# Patient Record
Sex: Male | Born: 1980 | Race: White | Hispanic: Yes | Marital: Single | Smoking: Never smoker
Health system: Southern US, Community
[De-identification: ages and names within clinical notes are randomized; demographics above are authoritative.]

---

## 2009-07-12 ENCOUNTER — Emergency Department (HOSPITAL_COMMUNITY): Admission: EM | Admit: 2009-07-12 | Discharge: 2009-07-12 | Payer: Self-pay | Admitting: Emergency Medicine

## 2011-04-24 ENCOUNTER — Other Ambulatory Visit: Payer: Self-pay | Admitting: Geriatric Medicine

## 2011-04-24 DIAGNOSIS — E291 Testicular hypofunction: Secondary | ICD-10-CM

## 2011-04-25 ENCOUNTER — Other Ambulatory Visit: Payer: Self-pay

## 2011-04-25 ENCOUNTER — Ambulatory Visit
Admission: RE | Admit: 2011-04-25 | Discharge: 2011-04-25 | Disposition: A | Discharge status: Home / Self Care | Payer: No Typology Code available for payment source | Source: Ambulatory Visit | Attending: Geriatric Medicine | Admitting: Geriatric Medicine

## 2011-04-25 DIAGNOSIS — E291 Testicular hypofunction: Secondary | ICD-10-CM

## 2011-06-25 ENCOUNTER — Other Ambulatory Visit: Payer: No Typology Code available for payment source

## 2011-06-25 ENCOUNTER — Other Ambulatory Visit: Payer: Self-pay | Admitting: Geriatric Medicine

## 2011-06-25 DIAGNOSIS — E291 Testicular hypofunction: Secondary | ICD-10-CM

## 2011-06-27 ENCOUNTER — Other Ambulatory Visit: Payer: No Typology Code available for payment source

## 2011-06-27 ENCOUNTER — Ambulatory Visit
Admission: RE | Admit: 2011-06-27 | Discharge: 2011-06-27 | Disposition: A | Discharge status: Home / Self Care | Payer: No Typology Code available for payment source | Source: Ambulatory Visit | Attending: Geriatric Medicine | Admitting: Geriatric Medicine

## 2011-06-27 DIAGNOSIS — E291 Testicular hypofunction: Secondary | ICD-10-CM

## 2011-06-27 MED ORDER — IOHEXOL 300 MG/ML  SOLN
75.0000 mL | Freq: Once | INTRAMUSCULAR | Status: AC | PRN
  Administered 2011-06-27: 75 mL via INTRAVENOUS

## 2013-06-21 IMAGING — CT CT HEAD W/ CM
3 of 4 series · 15 of 37 positions shown, 17 images · IV contrast (omnipaque)
Comparison: None.

CLINICAL DATA: Decreased testosterone.

CT HEAD WITH CONTRAST
TECHNIQUE: Contiguous axial images were obtained from the base of
the skull through the vertex with intravenous contrast
Contrast: 75mL OMNIPAQUE IOHEXOL 300 MG/ML  SOLN

[Series 4: recon 3: head w/cm · axial · 0.23mm/px · z∈[-29,+20]mm · 8 of 96 slices shown]
[im 9/96  brain]
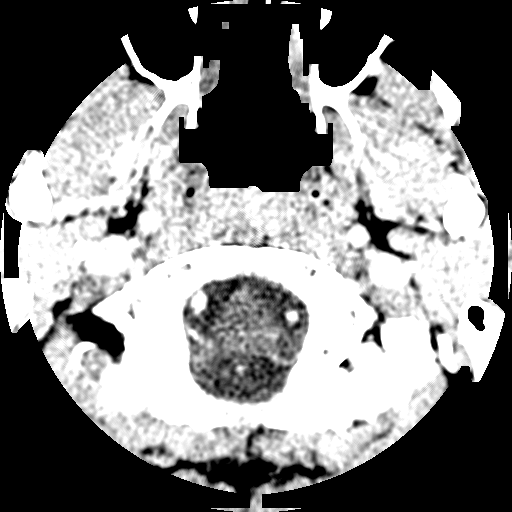
[im 18/96  brain]
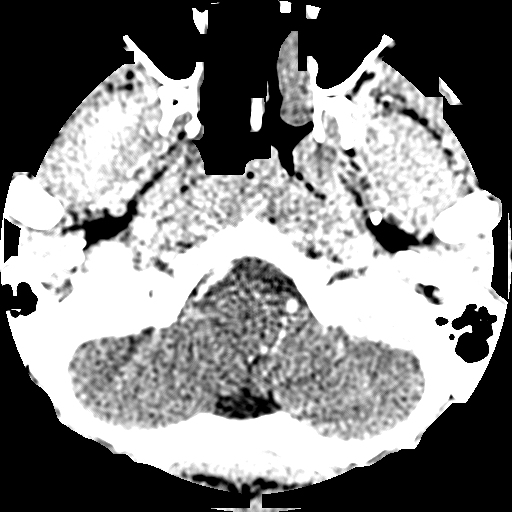
[im 31/96  brain]
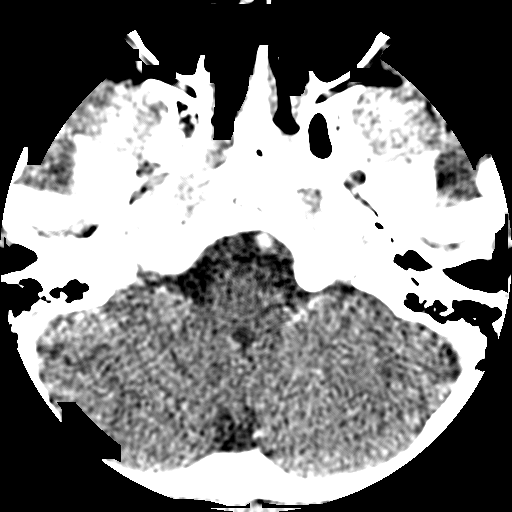
[im 44/96  brain]
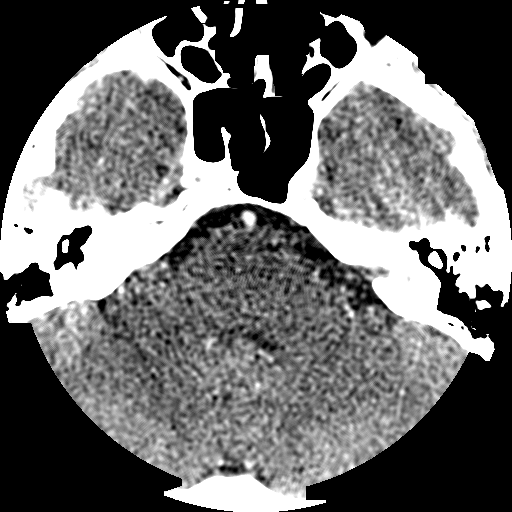
[im 52/96  brain]
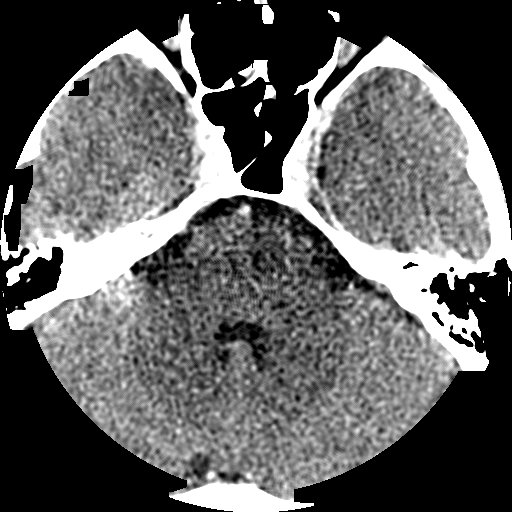
[im 65/96  brain]
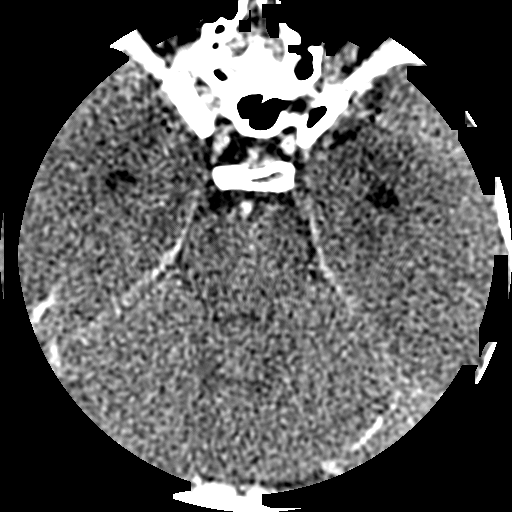
[im 78/96  brain]
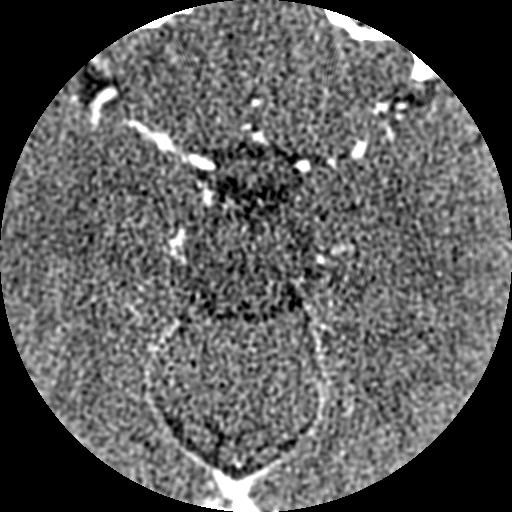
[im 87/96  brain]
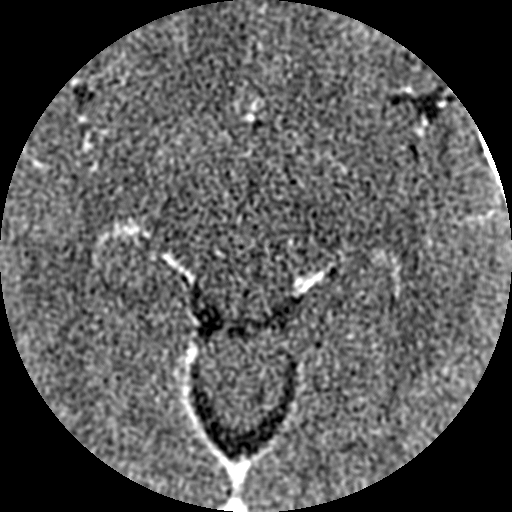

[Series 32: 3d filtered head w/cm · axial · 0.49mm/px · z∈[-12,+78]mm · 5 of 28 slices shown, 7 images]
[im 5/28  brain]
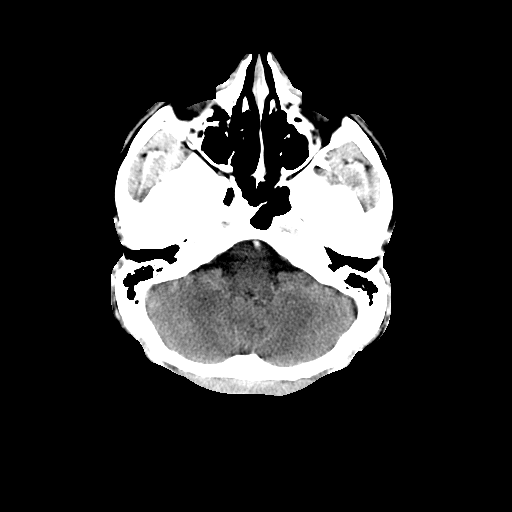
[im 5/28  bone]
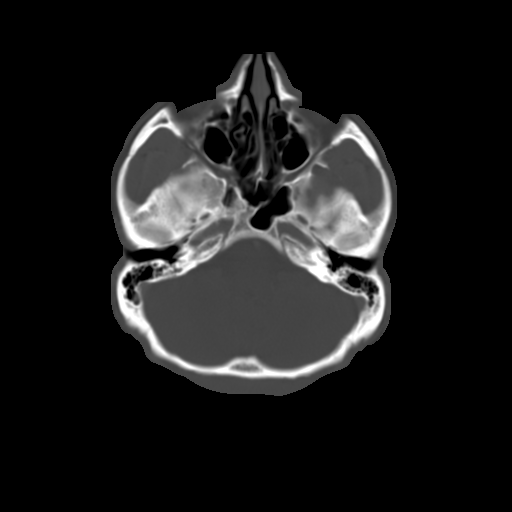
[im 10/28  brain]
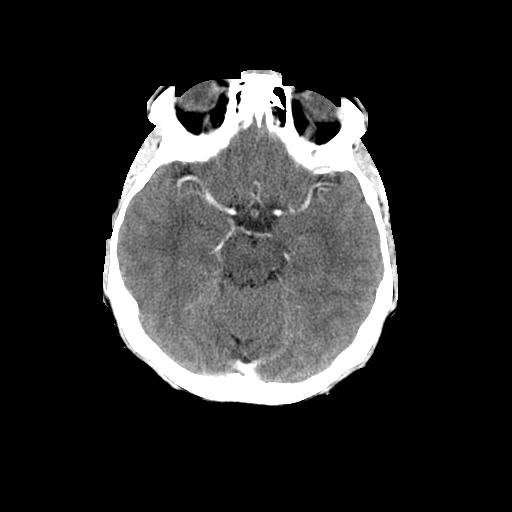
[im 14/28  brain]
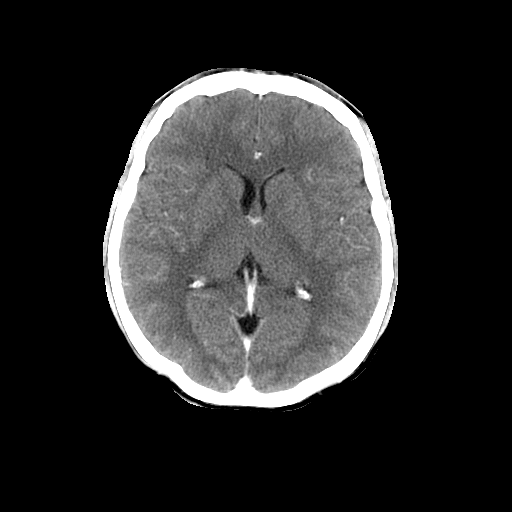
[im 19/28  brain]
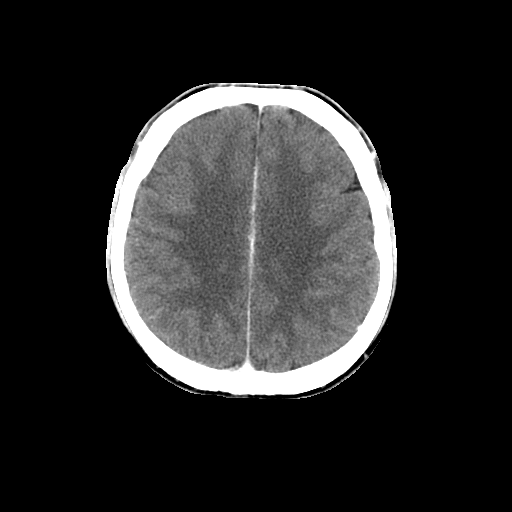
[im 23/28  brain]
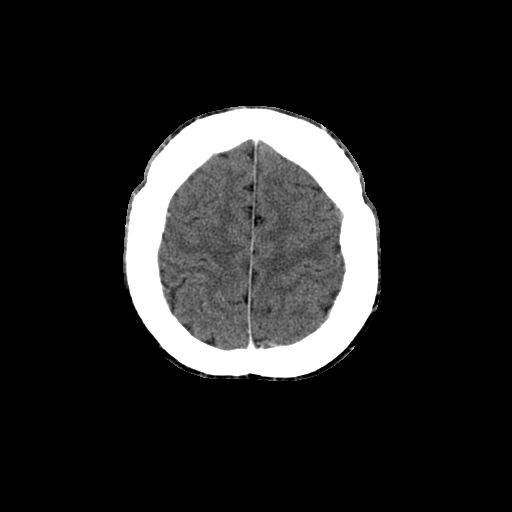
[im 23/28  bone]
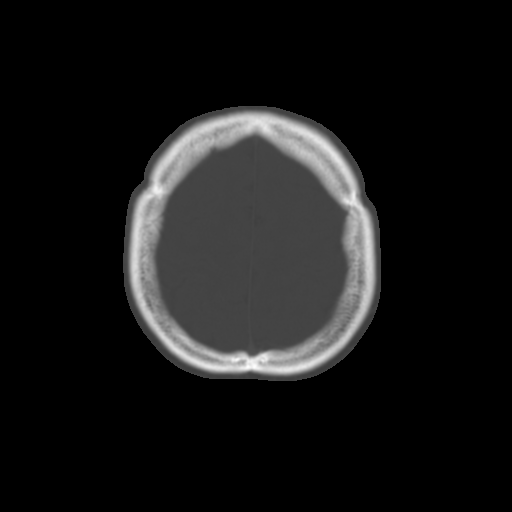

[Series 401: sag · sagittal · 0.23mm/px · 2 of 74 slices shown]
[im 25/74  brain]
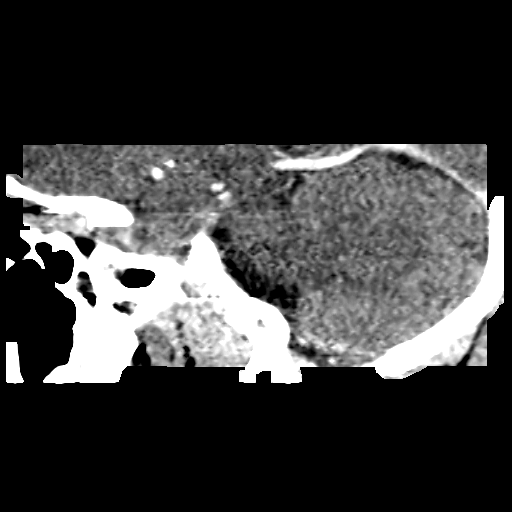
[im 49/74  brain]
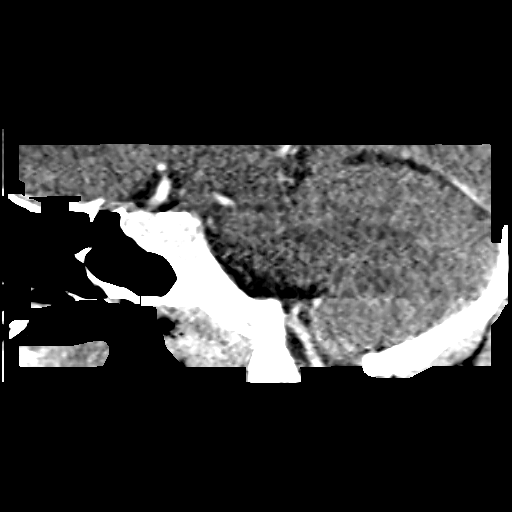

[15 of 37 positions shown; findings below may reference images not displayed]

FINDINGS: It was elected by the patient and Dr. Kandove to perform a
contrast enhanced CT scan (knowing the limitations of the exam as
versus MR for detection of small microadenoma) given the fact that
the patient is paying out of pocket for the examination.  With this
in mind, thin section imaging with attention to the pituitary gland
with coronal and sagittal reconstruction was performed in addition
to contrast enhanced standard head CT.

There is heterogeneous appearance of the pituitary gland. 2.9 x
x 2.1 mm area of decreased enhancement within the anterior left
paracentral aspect of the pituitary gland.  This may represent a
pituitary microadenoma versus a small cyst.

No obvious intracranial hemorrhage.  Precontrast imaging not
performed.

No hydrocephalus.

Minimal polypoid opacification anterior aspect left sphenoid sinus.
IMPRESSION: 2.9 x 4.1 x 2.1 mm area of decreased enhancement within the
anterior left paracentral aspect of the pituitary gland.  This may
represent a pituitary microadenoma versus a small cyst.

Please see above.

## 2013-10-04 ENCOUNTER — Other Ambulatory Visit: Payer: Self-pay | Admitting: *Deleted

## 2013-10-04 DIAGNOSIS — I83893 Varicose veins of bilateral lower extremities with other complications: Secondary | ICD-10-CM

## 2013-11-22 ENCOUNTER — Encounter (HOSPITAL_COMMUNITY): Payer: No Typology Code available for payment source

## 2013-11-22 ENCOUNTER — Encounter: Payer: No Typology Code available for payment source | Admitting: Vascular Surgery

## 2019-03-24 ENCOUNTER — Ambulatory Visit: Payer: Self-pay | Attending: Internal Medicine

## 2019-03-24 DIAGNOSIS — Z23 Encounter for immunization: Secondary | ICD-10-CM

## 2019-03-24 NOTE — Progress Notes (Signed)
   Covid-19 Vaccination Clinic  Name:  ROSARIO KUSHNER    MRN: [id number] DOB: 06/19/1980  03/24/2019  Mr. MEZA OSORIO was observed post Covid-19 immunization for 15 minutes without incident. He was provided with Vaccine Information Sheet and instruction to access the V-Safe system.   Mr. SAMAY DELCARLO was instructed to call 911 with any severe reactions post vaccine: Marland Kitchen Difficulty breathing  . Swelling of face and throat  . A fast heartbeat  . A bad rash all over body  . Dizziness and weakness   Immunizations Administered    Name Date Dose VIS Date Route   Pfizer COVID-19 Vaccine 03/24/2019  9:11 AM 0.3 mL 12/17/2018 Intramuscular   Manufacturer: [employer], [employer]   Lot: [id number]   NDC: [address]-2

## 2019-04-18 ENCOUNTER — Ambulatory Visit: Payer: Self-pay | Attending: Internal Medicine

## 2019-04-18 DIAGNOSIS — Z23 Encounter for immunization: Secondary | ICD-10-CM

## 2019-04-18 NOTE — Progress Notes (Signed)
   Covid-19 Vaccination Clinic  Name:  DAANISH COPES    MRN: [id number] DOB: 11-Oct-1980  04/18/2019  Mr. MEZA OSORIO was observed post Covid-19 immunization for 15 minutes without incident. He was provided with Vaccine Information Sheet and instruction to access the V-Safe system.   Mr. VANCE HOCHMUTH was instructed to call 911 with any severe reactions post vaccine: Marland Kitchen Difficulty breathing  . Swelling of face and throat  . A fast heartbeat  . A bad rash all over body  . Dizziness and weakness

## 2021-10-24 ENCOUNTER — Ambulatory Visit: Payer: Self-pay | Admitting: Podiatry

## 2021-10-28 ENCOUNTER — Ambulatory Visit (INDEPENDENT_AMBULATORY_CARE_PROVIDER_SITE_OTHER): Payer: Self-pay

## 2021-10-28 ENCOUNTER — Ambulatory Visit (INDEPENDENT_AMBULATORY_CARE_PROVIDER_SITE_OTHER): Payer: Self-pay | Admitting: Podiatry

## 2021-10-28 DIAGNOSIS — M79672 Pain in left foot: Secondary | ICD-10-CM

## 2021-10-28 DIAGNOSIS — M6740 Ganglion, unspecified site: Secondary | ICD-10-CM

## 2021-10-28 NOTE — Progress Notes (Signed)
ty

## 2021-10-28 NOTE — Patient Instructions (Signed)
Ganglion Cyst Drainage ?Ganglion cyst drainage is a procedure to drain a fluid-filled sac that forms near joints or on tendons or ligaments near joints (ganglion cyst). These cysts are filled with a clear fluid. They most often develop in the hand or wrist, but they can also develop in the shoulder, elbow, hip, knee, ankle, or foot. ?In this procedure, the fluid is drained from the cyst using a needle and syringe. This is called aspiration. You may need aspiration if: ?The cyst is large and unsightly. ?The cyst is painful or limits movement. ?The cyst is pushing on a nerve and causing numbness, tingling, or weakness. ?Aspiration may be done several times. If the cyst keeps coming back after aspiration, you may need surgery to remove the cyst. ?Tell a health care provider about: ?Any allergies you have. ?All medicines you are taking, including vitamins, herbs, eye drops, creams, and over-the-counter medicines. ?Any problems you or family members have had with anesthetic medicines. ?Any blood disorders you have. ?Any surgeries you have had. ?Any medical conditions you have. ?Whether you are pregnant or may be pregnant. ?What are the risks? ?Generally, this is a safe procedure. However, problems may occur, including: ?The cyst coming back again (recurrence). This is the most common risk. ?Stiffness or limited movement. ?Damage to nearby structures, such as tendons, nerves, or blood vessels. ?Infection. ?Bleeding. ?Allergic reactions to medicines. ?What happens before the procedure? ?You may have an imaging test, such as an X-ray, MRI, CT scan, or ultrasound. ?Ask your health care provider about: ?Changing or stopping your regular medicines. This is especially important if you are taking diabetes medicines or blood thinners. ?Taking medicines such as aspirin and ibuprofen. These medicines can thin your blood. Do not take these medicines unless your health care provider tells you to take them. ?Taking over-the-counter  medicines, vitamins, herbs, and supplements. ?Ask your health care provider if you should: ?Plan to have someone take you home from the hospital or clinic. ?Plan to have someone with you for 24 hours. ?Ask your health care provider what steps will be taken to help prevent infection. These steps may include washing skin with a germ-killing soap. ?What happens during the procedure? ?An IV may be inserted into one of your veins. ?You may be given: ?A medicine to help you relax (sedative). ?A medicine to numb the area (local anesthetic). This will be injected around the cyst. ?When the area is numb, a needle with a syringe will be inserted into the cyst. If the cyst is near a main blood vessel (artery), your health care provider may do an ultrasound to help avoid the artery while placing the needle. ?Fluid from the cyst will be drawn into the syringe to shrink the size of the cyst. ?In some cases, pressure may be applied to the cyst to force remaining fluid out of the cyst into the surrounding tissue under the skin. ?Medicine may be injected into the cyst to decrease inflammation. This medicine may be corticosteroids, ethanol, or hyaluronidase. ?The needle will be removed, and a small bandage (dressing) will be placed over the puncture site. ?The procedure may vary among health care providers and hospitals. ?What can I expect after the procedure? ?You will be able to go home right after the procedure. ?If you were given a sedative during the procedure, it can affect you for several hours. Do not drive or operate machinery until your health care provider says that it is safe. ?You may have to wear a splint or brace. ?  After the procedure, it is common to have some soreness, swelling, and stiffness in the affected area. ?Follow these instructions at home: ?If you have a splint or brace: ?Wear it as told by your health care provider. Remove it only as told by your health care provider. You may need to wear the splint or  brace for several days. ?Loosen the splint or brace if your fingers (or toes) tingle, become numb, or turn cold and blue. ?Keep the splint or brace clean. ?If the splint or brace is not waterproof: ?Do not let it get wet. ?Ask if you can remove it for bathing. If not, cover it with a watertight covering when you take a bath or shower. ?Managing pain, stiffness, and swelling ? ?If directed, put ice on the puncture area. To do this: ?If you have a removable splint or brace, remove it as told by your health care provider. ?Put ice in a plastic bag. ?Place a towel between your skin and the bag or between the splint or brace and the bag. ?Leave the ice on for 20 minutes, 2-3 times a day. ?Move your affected joint or extremity often to reduce stiffness and swelling. ?Raise (elevate) the puncture area above the level of your heart while you are sitting or lying down. ?Puncture site care ? ?Remove your bandage when your health care provider says you can. ?Check your puncture area every day for signs of infection. Check for: ?Redness, swelling, or pain. ?Fluid or blood. ?Warmth. ?Pus or a bad smell. ?Do not take baths, swim, or use a hot tub until your health care provider approves. ?Activity ?Return to your normal activities as told by your health care provider. Ask your health care provider what activities are safe for you. ?Avoid activities that cause pain. ?Ask your health care provider when it is safe to drive and when you should start doing movement exercises. ?General instructions ?Take over-the-counter and prescription medicines only as told by your health care provider. ?Do not use any products that contain nicotine or tobacco, such as cigarettes, e-cigarettes, and chewing tobacco. These can delay healing. If you need help quitting, ask your health care provider. ?Keep all follow-up visits as told by your health care provider. This is important. ?Contact a health care provider if: ?Medicine is not controlling your  pain. ?You have stiffness or swelling that gets worse. ?Your splint or brace is not fitting correctly, causing your fingers (or toes) to tingle, become numb, or turn cold and blue. ?You have any signs of infection at your puncture site. ?You have a fever. ?Summary ?A ganglion cyst is a fluid-filled sac that forms near joints or on tendons or ligaments near joints. ?Ganglion cysts most often develop in the hand or wrist, but they can also develop in the shoulder, elbow, hip, knee, ankle, or foot. ?In ganglion cyst drainage, a needle is placed into the cyst to drain it into a syringe (aspiration). ?Sometimes ganglion cysts come back after drainage. Aspiration may be done several times. ?If the cyst continues to come back after drainage, you may need surgery to remove the cyst. ?This information is not intended to replace advice given to you by your health care provider. Make sure you discuss any questions you have with your health care provider. ?Document Revised: 03/18/2019 Document Reviewed: 03/18/2019 ?Elsevier Patient Education ? 2023 [employer] ? ?

## 2021-10-29 ENCOUNTER — Telehealth: Payer: Self-pay | Admitting: Podiatry

## 2021-10-29 NOTE — Telephone Encounter (Signed)
Called [facility] giving them the source of the fluid specimen, not listed on requistion form,(left foot, possible ganglion cyst(note not available in epic as of yet).

## 2021-10-29 NOTE — Telephone Encounter (Signed)
Received call from [place] lab asking where the sample of fluid they received was taken from and I note was not dictated so I transferred to nurse triage line.

## 2021-10-30 LAB — NON-GYN, SPECIMEN A

## 2021-10-30 LAB — CYTOLOGY - NON PAP

## 2021-10-31 NOTE — Progress Notes (Signed)
Subjective:   Patient ID: Cory Black, male   DOB: 41 y.o.   MRN: [id number]   HPI Chief Complaint  Patient presents with   soft tiisue mass    Left foot  soft tissue mass, between the top of the hallux and 2nd toe, started 2 months ago, patient denies any pain, X-rays taken today     41 year old male presents for above concerns.  He has a soft tissue mass in the top of his left foot for 2 months.  States that it was getting larger but now it is about the same in size.  No injury he reports.  No recent treatment.   Review of Systems  All other systems reviewed and are negative.  No past medical history on file.  No current outpatient medications on file.  Not on File         Objective:  Physical Exam  General: AAO x3, NAD  Dermatological: Skin is warm, dry and supple bilateral. There are no open sores, no preulcerative lesions, no rash or signs of infection present.  Vascular: Dorsalis Pedis artery and Posterior Tibial artery pedal pulses are 2/4 bilateral with immedate capillary fill time. There is no pain with calf compression, swelling, warmth, erythema.   Neruologic: Grossly intact via light touch bilateral.   Musculoskeletal: The dorsal aspect of the left hallux is a fluid-filled mobile soft tissue mass measuring one by one.  This is lateral to the extensor tendon.  Mild tenderness palpation.  There is no crepitation.  Gait: Unassisted, Nonantalgic.       Assessment:   Soft tissue mass likely ganglion cyst left foot     Plan:  -Treatment options discussed including all alternatives, risks, and complications -Etiology of symptoms were discussed -X-rays were obtained and reviewed with the patient.  3 views left foot were obtained.  No evidence of acute fracture.  Soft tissue swelling noted along the area of the cyst.  No soft tissue edema. -We discussed both conservative as well as surgical excision.  After discussion wants to try aspiration today.   Discussed risks.  Skin was cleaned and 3 cc of lidocaine, Marcaine was infiltrated into regional block fashion.  Once anesthetized cleaned skin with Betadine, alcohol utilized an 18-gauge needle to aspirate clear, jellylike fluid.  I removed approximately 6 mL of fluid.  I did a sample to pathology.  He tolerated well.  A compression bandage was applied.  Postprocedure instructions discussed. -Discussed with recurrent surgical excision likely.  Trula Slade DPM

## 2021-11-07 ENCOUNTER — Other Ambulatory Visit: Payer: Self-pay | Admitting: Podiatry

## 2021-11-07 DIAGNOSIS — M6740 Ganglion, unspecified site: Secondary | ICD-10-CM

## 2021-11-07 DIAGNOSIS — M79672 Pain in left foot: Secondary | ICD-10-CM

## 2021-11-14 ENCOUNTER — Telehealth: Payer: Self-pay

## 2022-06-06 ENCOUNTER — Encounter (HOSPITAL_COMMUNITY): Payer: Self-pay

## 2022-06-06 ENCOUNTER — Ambulatory Visit (HOSPITAL_COMMUNITY)
Admission: EM | Admit: 2022-06-06 | Discharge: 2022-06-06 | Disposition: A | Discharge status: Home / Self Care | Payer: Self-pay | Attending: Internal Medicine | Admitting: Internal Medicine

## 2022-06-06 DIAGNOSIS — H01004 Unspecified blepharitis left upper eyelid: Secondary | ICD-10-CM

## 2022-06-06 DIAGNOSIS — H109 Unspecified conjunctivitis: Secondary | ICD-10-CM

## 2022-06-06 MED ORDER — ERYTHROMYCIN 5 MG/GM OP OINT: TOPICAL_OINTMENT | OPHTHALMIC | 0 refills | Status: AC

## 2022-06-06 MED ORDER — SULFAMETHOXAZOLE-TRIMETHOPRIM 800-160 MG PO TABS: 1.0000 | ORAL_TABLET | Freq: Two times a day (BID) | ORAL | 0 refills | Status: AC

## 2022-06-06 NOTE — ED Provider Notes (Signed)
[facility]    CSN: [id number] Arrival date & time: 06/06/22  0803      History   Chief Complaint Chief Complaint  Patient presents with   Eye Problem    HPI Cory Black is a 42 y.o. male.   Patient presents to clinic over right upper eyelid swelling since Monday.  He woke up today with some purulent discharge crusted on his eyelashes.  He has tried warm compresses and has been using an antibiotic eyedrop (terramicina).  He is concerned because his right upper eyelid is red, swollen and getting worse instead of better.  He denies any changes in vision, denies pain.  Does not wear contacts.  No fevers.     The history is provided by the patient and medical records. The history is limited by a language barrier. No language interpreter was used.  Eye Problem Associated symptoms: discharge and redness   Associated symptoms: no itching and no photophobia     History reviewed. No pertinent past medical history.  There are no problems to display for this patient.   History reviewed. No pertinent surgical history.     Home Medications    Prior to Admission medications   Medication Sig Start Date End Date Taking? Authorizing Provider  erythromycin ophthalmic ointment Place a 1/2 inch ribbon of ointment into the lower eyelid 4x daily for 5 days. 06/06/22  Yes Rinaldo Ratel, [place] N, FNP  sulfamethoxazole-trimethoprim (BACTRIM DS) 800-160 MG tablet Take 1 tablet by mouth 2 (two) times daily for 5 days. 06/06/22 06/11/22 Yes Cory Black, [place] N, FNP    Family History History reviewed. No pertinent family history.  Social History Social History   Tobacco Use   Smoking status: Never   Smokeless tobacco: Never  Substance Use Topics   Alcohol use: Not Currently   Drug use: Never     Allergies   Patient has no known allergies.   Review of Systems Review of Systems  Constitutional:  Negative for fever.  Eyes:  Positive for discharge and redness.  Negative for photophobia, pain, itching and visual disturbance.     Physical Exam Triage Vital Signs ED Triage Vitals  Enc Vitals Group     BP 06/06/22 0828 121/61     Pulse Rate 06/06/22 0828 64     Resp 06/06/22 0828 18     Temp 06/06/22 0828 98.5 F (36.9 C)     Temp Source 06/06/22 0828 Oral     SpO2 06/06/22 0828 96 %     Weight --      Height --      Head Circumference --      Peak Flow --      Pain Score 06/06/22 0829 0     Pain Loc --      Pain Edu? --      Excl. in GC? --    No data found.  Updated Vital Signs BP 121/61 (BP Location: Left Arm)   Pulse 64   Temp 98.5 F (36.9 C) (Oral)   Resp 18   SpO2 96%   Visual Acuity Right Eye Distance: 20/20 Left Eye Distance: 20/20 Bilateral Distance: 20/20  Right Eye Near:   Left Eye Near:    Bilateral Near:     Physical Exam Vitals and nursing note reviewed.  Constitutional:      Appearance: Normal appearance.  HENT:     Head: Normocephalic and atraumatic.     Right Ear: External ear normal.  Left Ear: External ear normal.     Nose: Nose normal.     Mouth/Throat:     Mouth: Mucous membranes are moist.  Eyes:     General:        Right eye: Discharge present.     Pupils: Pupils are equal, round, and reactive to light.  Cardiovascular:     Rate and Rhythm: Normal rate.  Pulmonary:     Effort: Pulmonary effort is normal. No respiratory distress.  Musculoskeletal:     Cervical back: Normal range of motion.  Lymphadenopathy:     Cervical: No cervical adenopathy.  Skin:    General: Skin is warm and dry.  Neurological:     General: No focal deficit present.     Mental Status: He is alert and oriented to person, place, and time.  Psychiatric:        Mood and Affect: Mood normal.        Behavior: Behavior normal.      UC Treatments / Results  Labs (all labs ordered are listed, but only abnormal results are displayed) Labs Reviewed - No data to display  EKG   Radiology No results  found.  Procedures Procedures (including critical care time)  Medications Ordered in UC Medications - No data to display  Initial Impression / Assessment and Plan / UC Course  I have reviewed the triage vital signs and the nursing notes.  Pertinent labs & imaging results that were available during my care of the patient were reviewed by me and considered in my medical decision making (see chart for details).  Vitals in triage reviewed, patient is hemodynamically stable.  Right upper eyelid swelling with purulent discharge.  Apparent stye. PERRLA.  Concern over periorbital cellulitis if symptoms continue to progress, covered with Bactrim and erythromycin eyedrops.  Patient does not wear contacts.  Given contact for ophthalmology and strict emergency precautions, patient verbalized understanding, no questions at this time.     Final Clinical Impressions(s) / UC Diagnoses   Final diagnoses:  Bacterial conjunctivitis of left eye  Blepharitis of left upper eyelid, unspecified type     Discharge Instructions      Overall your symptoms are consistent with bacterial infection of your eye, I am concerned that it is spreading to your eyelid.  Please take all antibiotics as prescribed until finished.  You can take the oral antibiotics with food to help prevent gastrointestinal upset.  Please use the erythromycin ointment 4 times daily for the next 5 days.  The ointment may blur your vision for a few minutes while it settles then, the ointment allows for full or coverage and spreading of the antibiotic than just drops.  Please use a warm or cool compress.  You can take Tylenol or ibuprofen for pain and discomfort.  Please seek immediate care at the nearest emergency department or follow-up immediately with an eye doctor if you develop vision loss, eye pain, or no improvement in the next 72 hours.      ED Prescriptions     Medication Sig Dispense Auth. Provider   erythromycin ophthalmic  ointment Place a 1/2 inch ribbon of ointment into the lower eyelid 4x daily for 5 days. 3.5 g Cory Black, [place] N, FNP   sulfamethoxazole-trimethoprim (BACTRIM DS) 800-160 MG tablet Take 1 tablet by mouth 2 (two) times daily for 5 days. 10 tablet Cory Black, [place] N, FNP      PDMP not reviewed this encounter.   Rinaldo Ratel [place] N, [place] 06/06/22 [id number]

## 2022-06-06 NOTE — Discharge Instructions (Addendum)
Overall your symptoms are consistent with bacterial infection of your eye, I am concerned that it is spreading to your eyelid.  Please take all antibiotics as prescribed until finished.  You can take the oral antibiotics with food to help prevent gastrointestinal upset.  Please use the erythromycin ointment 4 times daily for the next 5 days.  The ointment may blur your vision for a few minutes while it settles then, the ointment allows for full or coverage and spreading of the antibiotic than just drops.  Please use a warm or cool compress.  You can take Tylenol or ibuprofen for pain and discomfort.  Please seek immediate care at the nearest emergency department or follow-up immediately with an eye doctor if you develop vision loss, eye pain, or no improvement in the next 72 hours.

## 2022-06-06 NOTE — ED Triage Notes (Signed)
Pt c/o rt upper eye lid redness and swelling since Monday. States using antibiotic ointment and warm compresses with no relief.
